# Patient Record
Sex: Male | Born: 1960 | Race: White | Hispanic: No | Marital: Single | Smoking: Former smoker
Health system: Southern US, Community
[De-identification: ages and names within clinical notes are randomized; demographics above are authoritative.]

## PROBLEM LIST (undated history)

## (undated) DIAGNOSIS — E785 Hyperlipidemia, unspecified: Secondary | ICD-10-CM

---

## 2004-03-30 ENCOUNTER — Ambulatory Visit (HOSPITAL_COMMUNITY): Admission: RE | Admit: 2004-03-30 | Discharge: 2004-03-30 | Payer: Self-pay | Admitting: Gastroenterology

## 2008-07-01 ENCOUNTER — Encounter: Admission: RE | Admit: 2008-07-01 | Discharge: 2008-07-01 | Payer: Self-pay | Admitting: Family Medicine

## 2009-03-21 ENCOUNTER — Emergency Department (HOSPITAL_COMMUNITY): Admission: EM | Admit: 2009-03-21 | Discharge: 2009-03-21 | Payer: Self-pay | Admitting: Emergency Medicine

## 2009-03-27 ENCOUNTER — Ambulatory Visit (HOSPITAL_COMMUNITY): Admission: RE | Admit: 2009-03-27 | Discharge: 2009-03-27 | Payer: Self-pay | Admitting: Urology

## 2010-08-23 ENCOUNTER — Ambulatory Visit (HOSPITAL_COMMUNITY)
Admission: RE | Admit: 2010-08-23 | Discharge: 2010-08-23 | Payer: Self-pay | Source: Home / Self Care | Attending: Gastroenterology | Admitting: Gastroenterology

## 2010-12-23 LAB — BASIC METABOLIC PANEL
CO2: 25 mEq/L (ref 19–32)
Calcium: 9.3 mg/dL (ref 8.4–10.5)
GFR calc non Af Amer: >60 mL/min (ref >60)
Glucose, Bld: 132 mg/dL — ABNORMAL HIGH (ref 70–99)
Potassium: 3.9 mEq/L (ref 3.5–5.1)

## 2010-12-23 LAB — CBC
HCT: 43.8 % (ref 39.0–52.0)
Hemoglobin: 14.7 g/dL (ref 13.0–17.0)
MCHC: 33.5 g/dL (ref 30.0–36.0)
Platelets: 256 10*3/uL (ref 150–400)
RDW: 12.8 % (ref 11.5–15.5)
WBC: 12.6 10*3/uL — ABNORMAL HIGH (ref 4.0–10.5)

## 2010-12-23 LAB — URINALYSIS, ROUTINE W REFLEX MICROSCOPIC
Nitrite: NEGATIVE
Protein, ur: 100 mg/dL — AB
Specific Gravity, Urine: 1.028 (ref 1.005–1.030)

## 2010-12-23 LAB — DIFFERENTIAL
Basophils Absolute: 0 10*3/uL (ref 0.0–0.1)
Monocytes Absolute: 0.9 10*3/uL (ref 0.1–1.0)
Monocytes Relative: 7 % (ref 3–12)
Neutro Abs: 10.3 10*3/uL — ABNORMAL HIGH (ref 1.7–7.7)

## 2010-12-23 LAB — URINE MICROSCOPIC-ADD ON

## 2011-05-10 IMAGING — CT CT PELVIS W/O CM
2 of 3 series · 17 of 36 positions shown, 19 images · non-contrast
Comparison: None

CT ABDOMEN

CLINICAL DATA: Right flank pain this morning with some dysuria

CT ABDOMEN AND PELVIS WITHOUT CONTRAST
TECHNIQUE: Multidetector CT imaging of the abdomen and pelvis was
performed following the standard protocol without intravenous
contrast.

[Series 4: lung windows · axial · 0.74mm/px · z∈[-224,-130]mm · 14 of 22 slices shown, 16 images]
[im 2/22  soft-tissue]
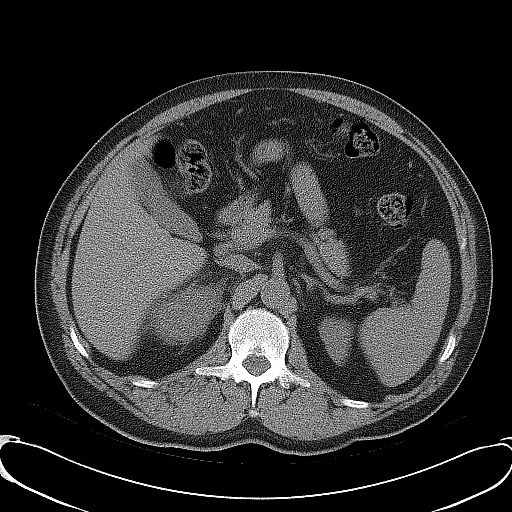
[im 2/22  bone]
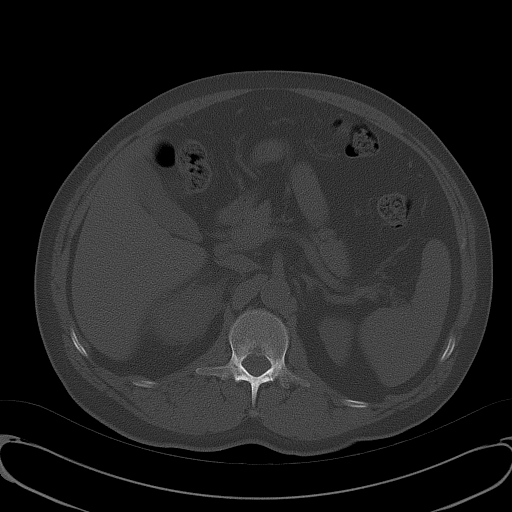
[im 4/22  soft-tissue]
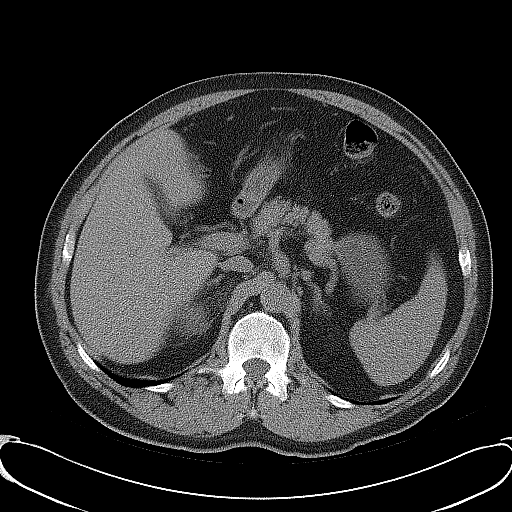
[im 5/22  soft-tissue]
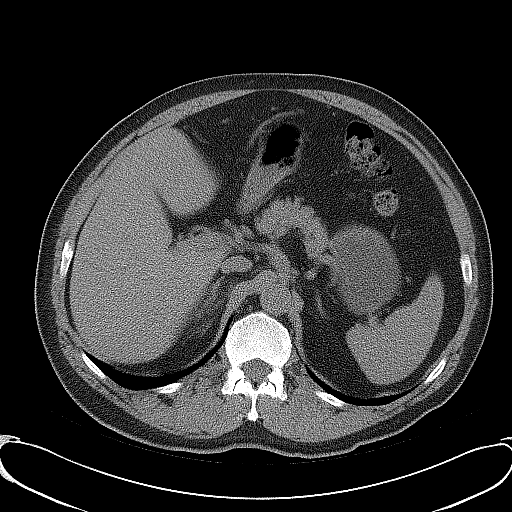
[im 7/22  soft-tissue]
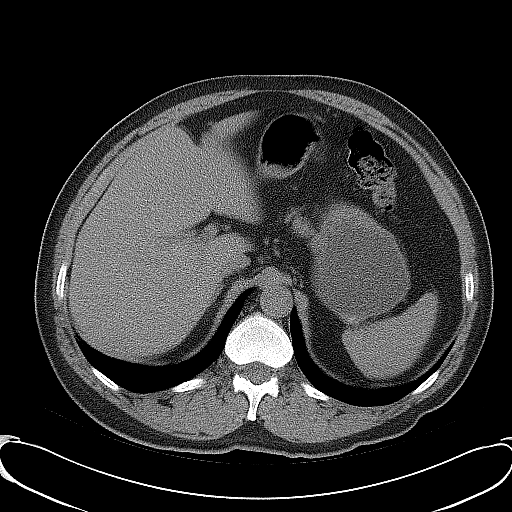
[im 8/22  soft-tissue]
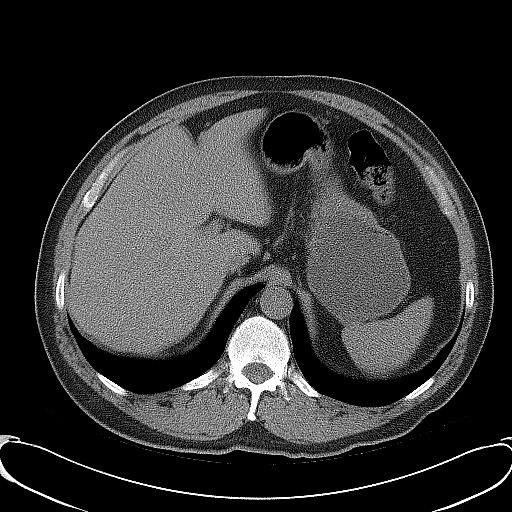
[im 9/22  soft-tissue]
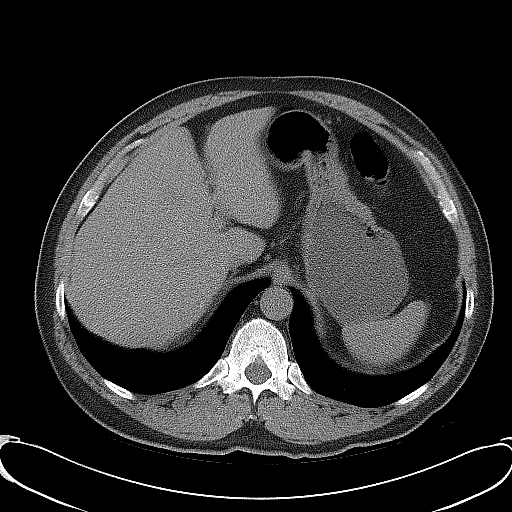
[im 11/22  soft-tissue]
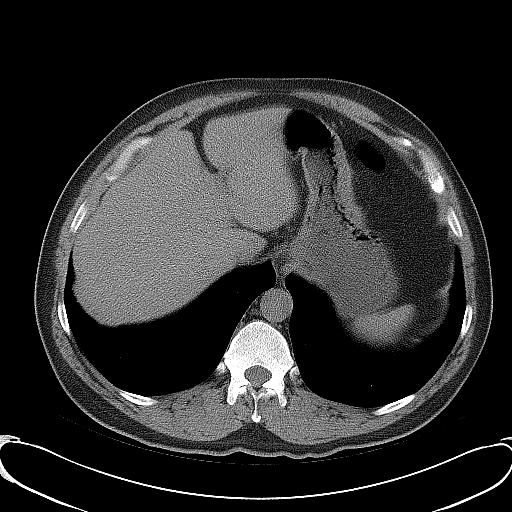
[im 12/22  soft-tissue]
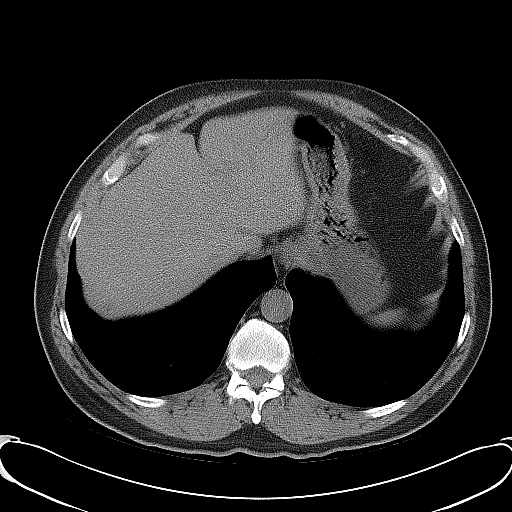
[im 14/22  soft-tissue]
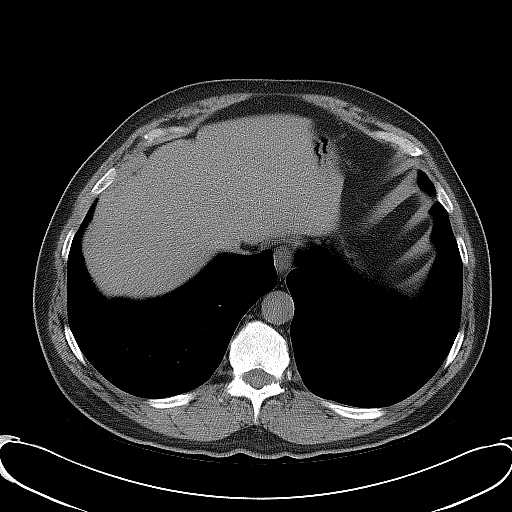
[im 14/22  bone]
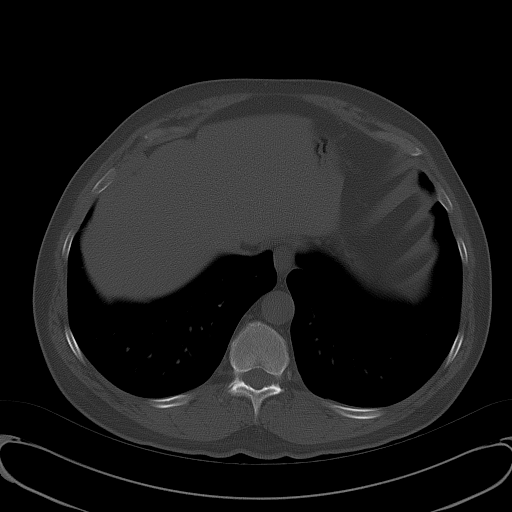
[im 15/22  soft-tissue]
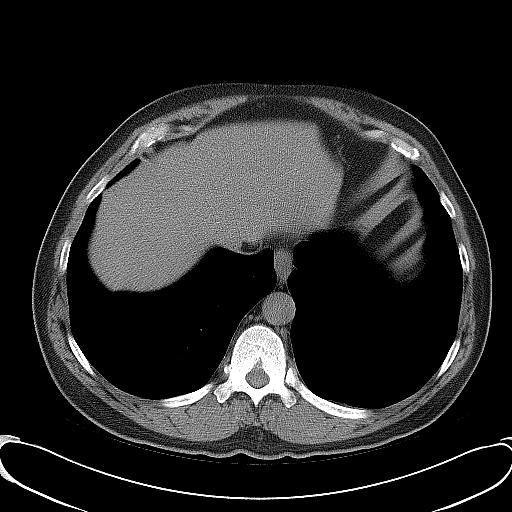
[im 17/22  soft-tissue]
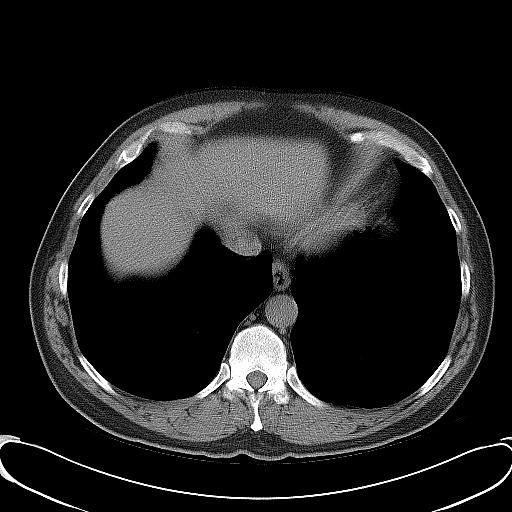
[im 18/22  soft-tissue]
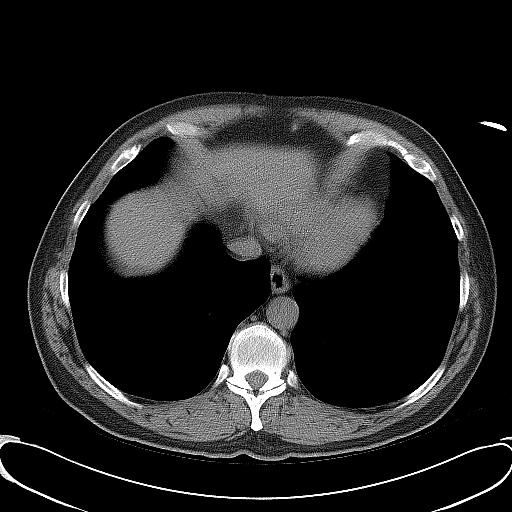
[im 19/22  soft-tissue]
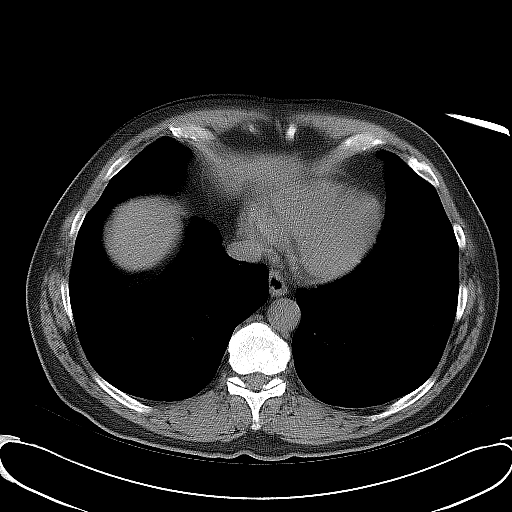
[im 21/22  soft-tissue]
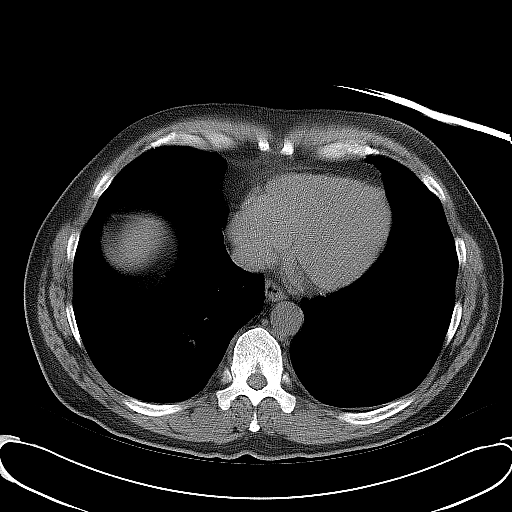

[Series 602: <mpr thick range> · coronal · 0.82mm/px · 3 of 88 slices shown]
[im 30/88  soft-tissue]
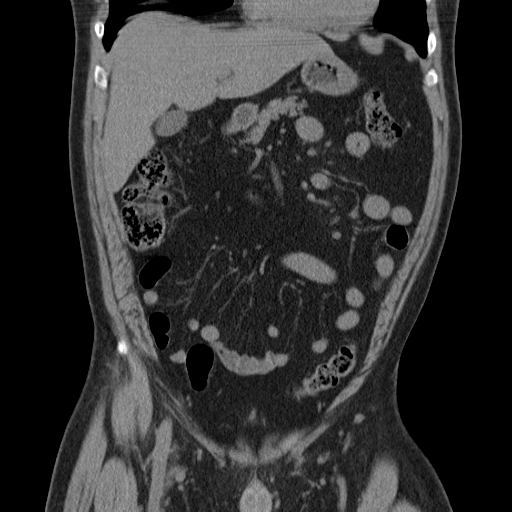
[im 39/88  soft-tissue]
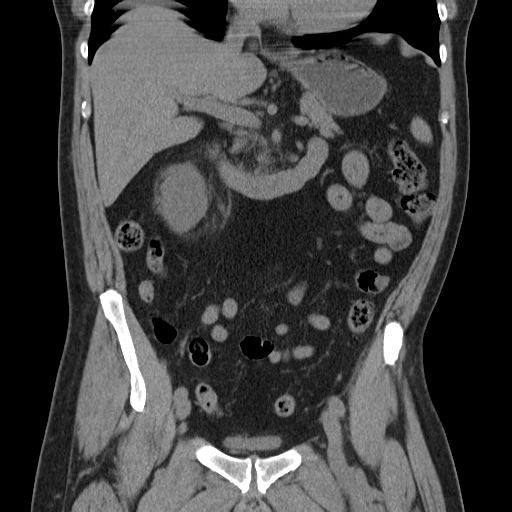
[im 49/88  soft-tissue]
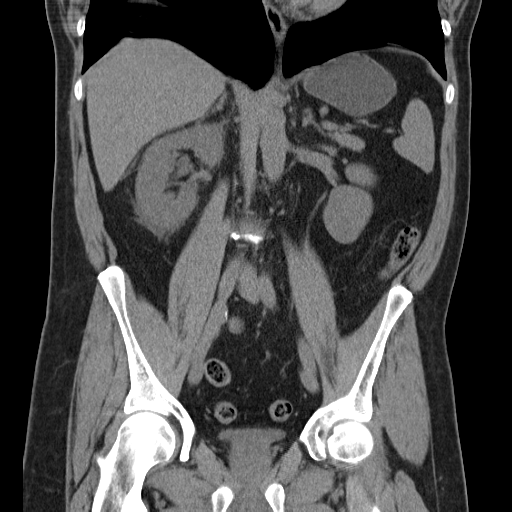

[17 of 36 positions shown; findings below may reference images not displayed]

FINDINGS: The lung bases are clear.  The liver appears normal in
the unenhanced state.  No calcified gallstones are seen.  The
pancreas is normal in size and the pancreatic duct is not dilated.
The adrenal glands and spleen appear normal.  The stomach is
grossly normal.  There is a moderate right hydronephrosis and
hydroureter present to a point of obstruction by a 6 x 7 mm mid
right ureteral calculus at the level of the right iliac crest.
Right perinephric and periureteric strandiness is noted.  No other
renal calculi are seen.  The abdominal aorta is normal in caliber.
IMPRESSION: 1.  Moderate right hydronephrosis caused by 6 x 7 mm mid right
ureteral calculus at the level of the right iliac crest.
 2.  No other renal calculi are noted.

CT PELVIS
FINDINGS: The appendix is well seen and appears normal, as is the
terminal ileum.  The distal ureters are normal in caliber and no
distal ureteral calculi are seen.  Urinary bladder is decompressed
cannot be evaluated.  The prostate is within normal limits in size
for age.  No bony abnormality is seen.
IMPRESSION: No distal ureteral calculi noted.  The terminal ileum and appendix
appear normal.

## 2017-09-03 ENCOUNTER — Other Ambulatory Visit: Payer: Self-pay | Admitting: Physician Assistant

## 2017-09-03 DIAGNOSIS — R1011 Right upper quadrant pain: Secondary | ICD-10-CM

## 2017-09-08 ENCOUNTER — Ambulatory Visit
Admission: RE | Admit: 2017-09-08 | Discharge: 2017-09-08 | Disposition: A | Discharge status: Home / Self Care | Payer: BLUE CROSS/BLUE SHIELD | Source: Ambulatory Visit | Attending: Physician Assistant | Admitting: Physician Assistant

## 2017-09-08 DIAGNOSIS — R1011 Right upper quadrant pain: Secondary | ICD-10-CM

## 2018-06-18 ENCOUNTER — Encounter (HOSPITAL_COMMUNITY): Payer: Self-pay | Admitting: Emergency Medicine

## 2018-06-18 ENCOUNTER — Emergency Department (HOSPITAL_COMMUNITY): Payer: BLUE CROSS/BLUE SHIELD

## 2018-06-18 ENCOUNTER — Emergency Department (HOSPITAL_COMMUNITY)
Admission: EM | Admit: 2018-06-18 | Discharge: 2018-06-18 | Disposition: A | Discharge status: Home / Self Care | Payer: BLUE CROSS/BLUE SHIELD | Attending: Emergency Medicine | Admitting: Emergency Medicine

## 2018-06-18 DIAGNOSIS — R072 Precordial pain: Secondary | ICD-10-CM | POA: Diagnosis not present

## 2018-06-18 DIAGNOSIS — Z79899 Other long term (current) drug therapy: Secondary | ICD-10-CM | POA: Diagnosis not present

## 2018-06-18 DIAGNOSIS — R079 Chest pain, unspecified: Secondary | ICD-10-CM | POA: Diagnosis present

## 2018-06-18 DIAGNOSIS — Z7982 Long term (current) use of aspirin: Secondary | ICD-10-CM | POA: Insufficient documentation

## 2018-06-18 DIAGNOSIS — Z87891 Personal history of nicotine dependence: Secondary | ICD-10-CM | POA: Insufficient documentation

## 2018-06-18 HISTORY — DX: Hyperlipidemia, unspecified: E78.5

## 2018-06-18 LAB — BASIC METABOLIC PANEL
ANION GAP: 11 (ref 5–15)
BUN: 7 mg/dL (ref 6–20)
CALCIUM: 9.2 mg/dL (ref 8.9–10.3)
CO2: 23 mmol/L (ref 22–32)
Chloride: 106 mmol/L (ref 98–111)
Creatinine, Ser: 0.64 mg/dL (ref 0.61–1.24)
GFR calc Af Amer: >60 mL/min (ref >60)
GLUCOSE: 111 mg/dL — AB (ref 70–99)
POTASSIUM: 3.4 mmol/L — AB (ref 3.5–5.1)
Sodium: 140 mmol/L (ref 135–145)

## 2018-06-18 LAB — I-STAT TROPONIN, ED
Troponin i, poc: 0 ng/mL (ref 0.00–0.08)
Troponin i, poc: 0 ng/mL (ref 0.00–0.08)

## 2018-06-18 LAB — CBC
HEMATOCRIT: 45.9 % (ref 39.0–52.0)
Hemoglobin: 14.9 g/dL (ref 13.0–17.0)
MCH: 31.2 pg (ref 26.0–34.0)
MCHC: 32.5 g/dL (ref 30.0–36.0)
MCV: 96.2 fL (ref 78.0–100.0)
Platelets: 213 10*3/uL (ref 150–400)
RBC: 4.77 MIL/uL (ref 4.22–5.81)
RDW: 12.1 % (ref 11.5–15.5)
WBC: 5.5 10*3/uL (ref 4.0–10.5)

## 2018-06-18 MED ORDER — ALUM & MAG HYDROXIDE-SIMETH 200-200-20 MG/5ML PO SUSP
30.0000 mL | Freq: Once | ORAL | Status: AC
  Administered 2018-06-18: 30 mL via ORAL
  Filled 2018-06-18: qty 30

## 2018-06-18 NOTE — ED Notes (Signed)
Pt stated that though his chest pain has diminished, he is still experiencing shaking in his right hand. Pt states that "it feels like I'm not getting any blood flow to that hand, like it's fallen asleep or something".

## 2018-06-18 NOTE — ED Notes (Signed)
Patient verbalizes understanding of discharge instructions. Opportunity for questioning and answers were provided. Ambulatory at discharge in NAD.  
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
## 2018-06-18 NOTE — ED Notes (Signed)
Per MD, Pt ok to have water. Given ~6oz ice water.

## 2018-06-18 NOTE — ED Provider Notes (Signed)
[facility] EMERGENCY DEPARTMENT Provider Note   CSN: [id number] Arrival date & time: 06/18/18  1019     History   Chief Complaint Chief Complaint  Patient presents with  . Chest Pain    HPI Rickey Kent is a 57 y.o. male.  HPI Patient is a 57 year old male with a history of hyperlipidemia and a family history of cardiac disease who presents to the emergency department today with a feeling of chest pressure in his left anterior chest without radiation.  He denies shortness of breath.  Denies nausea vomiting.  He has a history of hyperlipidemia.  No prior history of coronary artery disease.  No history of DVT or pulmonary embolism.  Denies weakness of his arms or legs.  No back pain.  No neck or jaw pain.  He states he overall feels "shaky".   Past Medical History:  Diagnosis Date  . Hyperlipidemia     There are no active problems to display for this patient.   History reviewed. No pertinent surgical history.      Home Medications    Prior to Admission medications   Medication Sig Start Date End Date Taking? Authorizing Provider  aspirin 325 MG tablet Take 325 mg by mouth daily.   Yes [provider]  atorvastatin (LIPITOR) 20 MG tablet Take 20 mg by mouth at bedtime. 10/10/15  Yes [provider]    Family History No family history on file.  Social History Social History   Tobacco Use  . Smoking status: Former Smoker    Last attempt to quit: 1990    Years since quitting: 29.7  . Smokeless tobacco: Never Used  Substance Use Topics  . Alcohol use: Yes  . Drug use: Never     Allergies   Patient has no known allergies.   Review of Systems Review of Systems  All other systems reviewed and are negative.    Physical Exam Updated Vital Signs BP (!) 154/95   Pulse 69   Temp 98.3 F (36.8 C) (Oral)   Resp 19   Ht 5' 11.5" (1.816 m)   Wt 74.8 kg   SpO2 97%   BMI 22.69 kg/m   Physical Exam  Constitutional: He  is oriented to person, place, and time. He appears well-developed and well-nourished.  HENT:  Head: Normocephalic and atraumatic.  Eyes: EOM are normal.  Neck: Normal range of motion.  Cardiovascular: Normal rate, regular rhythm, normal heart sounds and intact distal pulses.  Pulmonary/Chest: Effort normal and breath sounds normal. No respiratory distress.  Abdominal: Soft. He exhibits no distension. There is no tenderness.  Musculoskeletal: Normal range of motion.  5 out of 5 strength in bilateral upper and lower extremity major muscle groups.  Normal grip strength bilaterally.  Normal equal radial pulses bilaterally.  Neurological: He is alert and oriented to person, place, and time.  Skin: Skin is warm and dry.  Psychiatric: He has a normal mood and affect. Judgment normal.  Nursing note and vitals reviewed.    ED Treatments / Results  Labs (all labs ordered are listed, but only abnormal results are displayed) Labs Reviewed  BASIC METABOLIC PANEL - Abnormal; Notable for the following components:      Result Value   Potassium 3.4 (*)    Glucose, Bld 111 (*)    All other components within normal limits  CBC  I-STAT TROPONIN, ED  I-STAT TROPONIN, ED    EKG EKG Interpretation  Date/Time:  Thursday June 18 2018 10:28:51 EDT Ventricular Rate:  66 PR Interval:  144 QRS Duration: 84 QT Interval:  428 QTC Calculation: 448 R Axis:   75 Text Interpretation:  Normal sinus rhythm Normal ECG No old tracing to compare Confirmed by Azalia Bilis ([id number]) on 06/18/2018 12:16:00 PM   Radiology Dg Chest 2 View  Result Date: 06/18/2018 CLINICAL DATA:  Sudden onset of chest heaviness today also tremors. Hypertensive in the emergency department. EXAM: CHEST - 2 VIEW COMPARISON:  None. FINDINGS: The lungs are hyperinflated with hemidiaphragm flattening. There is no focal infiltrate. The heart and pulmonary vascularity are normal. The mediastinum is normal in width. There is calcification  in the wall of the aortic arch. The trachea is midline. The bony thorax exhibits no acute abnormality. IMPRESSION: COPD.  No pneumonia, CHF, nor other acute cardiopulmonary disease. Thoracic aortic atherosclerosis. Electronically Signed   By: David  [place] M.D.   On: 06/18/2018 11:26    Procedures Procedures (including critical care time)  Medications Ordered in ED Medications  alum & mag hydroxide-simeth (MAALOX/MYLANTA) 200-200-20 MG/5ML suspension 30 mL (30 mLs Oral Given 06/18/18 1236)     Initial Impression / Assessment and Plan / ED Course  I have reviewed the triage vital signs and the nursing notes.  Pertinent labs & imaging results that were available during my care of the patient were reviewed by me and considered in my medical decision making (see chart for details).     Nonspecific chest pain.  Feels better after belching.  Feels better after Maalox here in the emergency department.  No longer with chest discomfort.  EKG without acute ischemic changes.  Troponin negative x2.  Doubt PE.  Doubt dissection.  No abdominal discomfort.  Patient family encouraged to return in the emergency department for new or worsening symptoms.  Close PCP follow-up.  BP to be rechecked by his primary care provider  Final Clinical Impressions(s) / ED Diagnoses   Final diagnoses:  Precordial chest pain    ED Discharge Orders    None       Azalia Bilis, MD 06/18/18 1458

## 2018-08-26 ENCOUNTER — Emergency Department (HOSPITAL_COMMUNITY): Payer: BLUE CROSS/BLUE SHIELD

## 2018-08-26 ENCOUNTER — Observation Stay (HOSPITAL_COMMUNITY)
Admission: EM | Admit: 2018-08-26 | Discharge: 2018-08-26 | Discharge status: Against Medical Advice | Payer: BLUE CROSS/BLUE SHIELD | Attending: Surgery | Admitting: Surgery

## 2018-08-26 ENCOUNTER — Encounter (HOSPITAL_COMMUNITY): Payer: Self-pay | Admitting: Emergency Medicine

## 2018-08-26 DIAGNOSIS — N400 Enlarged prostate without lower urinary tract symptoms: Secondary | ICD-10-CM | POA: Diagnosis not present

## 2018-08-26 DIAGNOSIS — S27331A Laceration of lung, unilateral, initial encounter: Secondary | ICD-10-CM | POA: Insufficient documentation

## 2018-08-26 DIAGNOSIS — S2242XA Multiple fractures of ribs, left side, initial encounter for closed fracture: Principal | ICD-10-CM | POA: Insufficient documentation

## 2018-08-26 DIAGNOSIS — J9811 Atelectasis: Secondary | ICD-10-CM | POA: Diagnosis not present

## 2018-08-26 DIAGNOSIS — Z87891 Personal history of nicotine dependence: Secondary | ICD-10-CM | POA: Insufficient documentation

## 2018-08-26 DIAGNOSIS — Y92410 Unspecified street and highway as the place of occurrence of the external cause: Secondary | ICD-10-CM | POA: Insufficient documentation

## 2018-08-26 DIAGNOSIS — Y908 Blood alcohol level of 240 mg/100 ml or more: Secondary | ICD-10-CM | POA: Insufficient documentation

## 2018-08-26 DIAGNOSIS — J90 Pleural effusion, not elsewhere classified: Secondary | ICD-10-CM | POA: Insufficient documentation

## 2018-08-26 DIAGNOSIS — E785 Hyperlipidemia, unspecified: Secondary | ICD-10-CM | POA: Diagnosis not present

## 2018-08-26 DIAGNOSIS — J939 Pneumothorax, unspecified: Secondary | ICD-10-CM

## 2018-08-26 DIAGNOSIS — I700 Atherosclerosis of aorta: Secondary | ICD-10-CM | POA: Diagnosis not present

## 2018-08-26 DIAGNOSIS — S270XXA Traumatic pneumothorax, initial encounter: Secondary | ICD-10-CM | POA: Diagnosis not present

## 2018-08-26 DIAGNOSIS — Z79899 Other long term (current) drug therapy: Secondary | ICD-10-CM | POA: Diagnosis not present

## 2018-08-26 DIAGNOSIS — F1010 Alcohol abuse, uncomplicated: Secondary | ICD-10-CM | POA: Diagnosis not present

## 2018-08-26 DIAGNOSIS — S27339A Laceration of lung, unspecified, initial encounter: Secondary | ICD-10-CM

## 2018-08-26 DIAGNOSIS — S2249XA Multiple fractures of ribs, unspecified side, initial encounter for closed fracture: Secondary | ICD-10-CM | POA: Diagnosis present

## 2018-08-26 DIAGNOSIS — S2239XA Fracture of one rib, unspecified side, initial encounter for closed fracture: Secondary | ICD-10-CM

## 2018-08-26 DIAGNOSIS — F10929 Alcohol use, unspecified with intoxication, unspecified: Secondary | ICD-10-CM

## 2018-08-26 LAB — CBC WITH DIFFERENTIAL/PLATELET
Abs Immature Granulocytes: 0.03 10*3/uL (ref 0.00–0.07)
BASOS PCT: 1 %
Basophils Absolute: 0 10*3/uL (ref 0.0–0.1)
Eosinophils Absolute: 0.1 10*3/uL (ref 0.0–0.5)
Eosinophils Relative: 2 %
HCT: 43.3 % (ref 39.0–52.0)
Hemoglobin: 13.6 g/dL (ref 13.0–17.0)
Immature Granulocytes: 1 %
Lymphocytes Relative: 44 %
Lymphs Abs: 2.4 10*3/uL (ref 0.7–4.0)
MCH: 30.4 pg (ref 26.0–34.0)
MCHC: 31.4 g/dL (ref 30.0–36.0)
MCV: 96.7 fL (ref 80.0–100.0)
Monocytes Absolute: 0.3 10*3/uL (ref 0.1–1.0)
Monocytes Relative: 5 %
Neutro Abs: 2.6 10*3/uL (ref 1.7–7.7)
Neutrophils Relative %: 47 %
PLATELETS: 199 10*3/uL (ref 150–400)
RBC: 4.48 MIL/uL (ref 4.22–5.81)
RDW: 12.2 % (ref 11.5–15.5)
WBC: 5.4 10*3/uL (ref 4.0–10.5)
nRBC: 0 % (ref 0.0–0.2)

## 2018-08-26 LAB — COMPREHENSIVE METABOLIC PANEL
ALT: 66 U/L — ABNORMAL HIGH (ref 0–44)
AST: 80 U/L — ABNORMAL HIGH (ref 15–41)
Albumin: 4 g/dL (ref 3.5–5.0)
Alkaline Phosphatase: 52 U/L (ref 38–126)
Anion gap: 11 (ref 5–15)
BILIRUBIN TOTAL: 1.1 mg/dL (ref 0.3–1.2)
BUN: 7 mg/dL (ref 6–20)
CO2: 28 mmol/L (ref 22–32)
Calcium: 8.4 mg/dL — ABNORMAL LOW (ref 8.9–10.3)
Chloride: 104 mmol/L (ref 98–111)
Creatinine, Ser: 0.72 mg/dL (ref 0.61–1.24)
GFR calc Af Amer: >60 mL/min (ref >60)
Glucose, Bld: 108 mg/dL — ABNORMAL HIGH (ref 70–99)
Potassium: 3.9 mmol/L (ref 3.5–5.1)
Sodium: 143 mmol/L (ref 135–145)
Total Protein: 7.2 g/dL (ref 6.5–8.1)

## 2018-08-26 LAB — I-STAT CHEM 8, ED
BUN: 7 mg/dL (ref 6–20)
Calcium, Ion: 1.05 mmol/L — ABNORMAL LOW (ref 1.15–1.40)
Chloride: 104 mmol/L (ref 98–111)
Creatinine, Ser: 1.3 mg/dL — ABNORMAL HIGH (ref 0.61–1.24)
Glucose, Bld: 105 mg/dL — ABNORMAL HIGH (ref 70–99)
HCT: 43 % (ref 39.0–52.0)
Hemoglobin: 14.6 g/dL (ref 13.0–17.0)
Potassium: 3.9 mmol/L (ref 3.5–5.1)
Sodium: 143 mmol/L (ref 135–145)
TCO2: 32 mmol/L (ref 22–32)

## 2018-08-26 LAB — RAPID URINE DRUG SCREEN, HOSP PERFORMED
Amphetamines: NOT DETECTED
Barbiturates: NOT DETECTED
Benzodiazepines: NOT DETECTED
Cocaine: NOT DETECTED
Opiates: NOT DETECTED
Tetrahydrocannabinol: NOT DETECTED

## 2018-08-26 LAB — ETHANOL: ALCOHOL ETHYL (B): 354 mg/dL — AB (ref <10)

## 2018-08-26 MED ORDER — LORAZEPAM 2 MG/ML IJ SOLN: 1.0000 mg | INTRAMUSCULAR | Status: DC | PRN

## 2018-08-26 MED ORDER — ADULT MULTIVITAMIN W/MINERALS CH: 1.0000 | ORAL_TABLET | Freq: Every day | ORAL | Status: DC

## 2018-08-26 MED ORDER — IOHEXOL 300 MG/ML  SOLN
100.0000 mL | Freq: Once | INTRAMUSCULAR | Status: AC | PRN
  Administered 2018-08-26: 100 mL via INTRAVENOUS

## 2018-08-26 MED ORDER — HYDROMORPHONE HCL 1 MG/ML IJ SOLN: 1.0000 mg | INTRAMUSCULAR | Status: DC | PRN

## 2018-08-26 MED ORDER — VITAMIN B-1 100 MG PO TABS: 100.0000 mg | ORAL_TABLET | Freq: Every day | ORAL | Status: DC

## 2018-08-26 MED ORDER — METOPROLOL TARTRATE 5 MG/5ML IV SOLN: 5.0000 mg | Freq: Four times a day (QID) | INTRAVENOUS | Status: DC | PRN

## 2018-08-26 MED ORDER — ONDANSETRON 4 MG PO TBDP: 4.0000 mg | ORAL_TABLET | Freq: Four times a day (QID) | ORAL | Status: DC | PRN

## 2018-08-26 MED ORDER — PANTOPRAZOLE SODIUM 40 MG PO TBEC: 40.0000 mg | DELAYED_RELEASE_TABLET | Freq: Every day | ORAL | Status: DC

## 2018-08-26 MED ORDER — ONDANSETRON HCL 4 MG/2ML IJ SOLN: 4.0000 mg | Freq: Four times a day (QID) | INTRAMUSCULAR | Status: DC | PRN

## 2018-08-26 MED ORDER — ENOXAPARIN SODIUM 40 MG/0.4ML ~~LOC~~ SOLN: 40.0000 mg | SUBCUTANEOUS | Status: DC

## 2018-08-26 MED ORDER — PANTOPRAZOLE SODIUM 40 MG IV SOLR: 40.0000 mg | Freq: Every day | INTRAVENOUS | Status: DC

## 2018-08-26 MED ORDER — FOLIC ACID 1 MG PO TABS: 1.0000 mg | ORAL_TABLET | Freq: Every day | ORAL | Status: DC

## 2018-08-26 MED ORDER — ACETAMINOPHEN 325 MG PO TABS: 650.0000 mg | ORAL_TABLET | Freq: Three times a day (TID) | ORAL | Status: DC

## 2018-08-26 MED ORDER — KCL IN DEXTROSE-NACL 20-5-0.45 MEQ/L-%-% IV SOLN
INTRAVENOUS | Status: DC
  Filled 2018-08-26: qty 1000

## 2018-08-26 MED ORDER — OXYCODONE HCL 5 MG PO TABS: 5.0000 mg | ORAL_TABLET | ORAL | Status: DC | PRN

## 2018-08-26 NOTE — ED Notes (Signed)
Pt keeps pulling off leads and b/p cuff and up walking around
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
## 2018-08-26 NOTE — ED Notes (Signed)
Pt removed himself from C collar and took off all cardiac leads.
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
## 2018-08-26 NOTE — ED Notes (Signed)
Pt resting in bed at this time. 

## 2018-08-26 NOTE — ED Notes (Signed)
Pt ambulated to RR with stand by assistance by [place] EMT
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
## 2018-08-26 NOTE — ED Notes (Signed)
Pt transported to CT ?

## 2018-08-26 NOTE — ED Notes (Signed)
Pt signed out AMA , pt given IS and told hid fiance reasons to bring pt back to ED , pt and family verbalized understanding

## 2018-08-27 ENCOUNTER — Encounter (HOSPITAL_COMMUNITY): Payer: Self-pay | Admitting: Emergency Medicine

## 2018-10-13 IMAGING — US US ABDOMEN COMPLETE
1 series · 14 of 25 positions shown · non-contrast
Comparison: CT 03/21/2009

CLINICAL DATA: Right upper quadrant abdominal pain over the last 2
years.

EXAM:
ABDOMEN ULTRASOUND COMPLETE

[Series 1: us abdomen complete · 0.19mm/px · 14 of 80 slices shown]
[im 1/80]
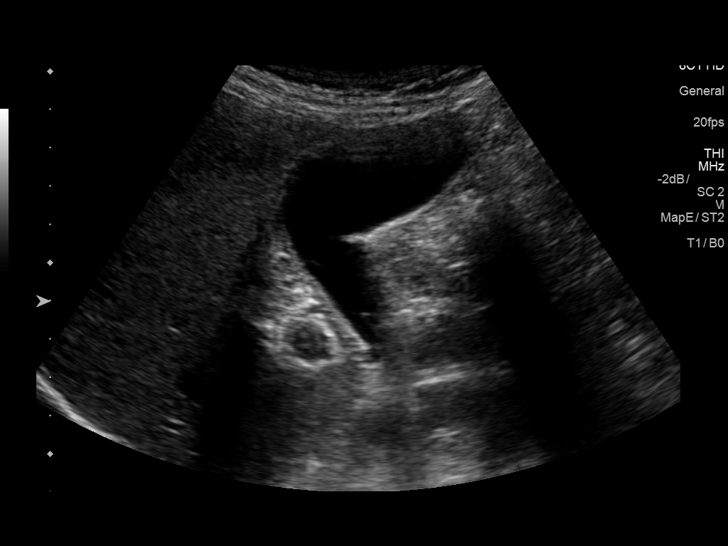
[im 7/80]
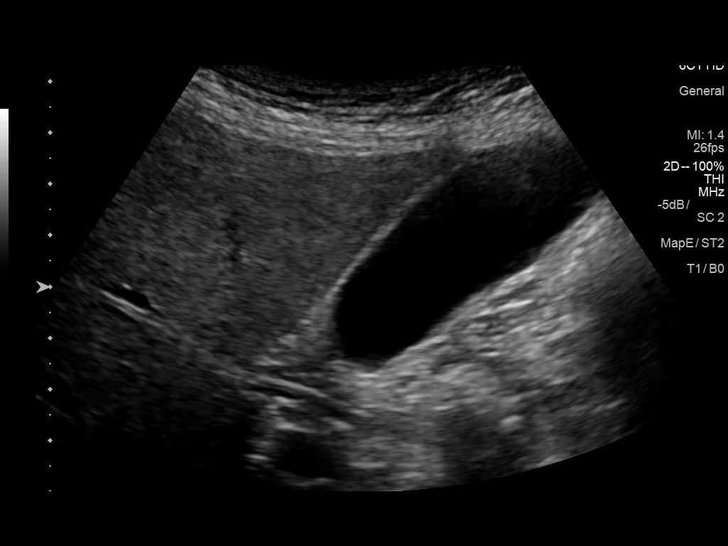
[im 14/80]
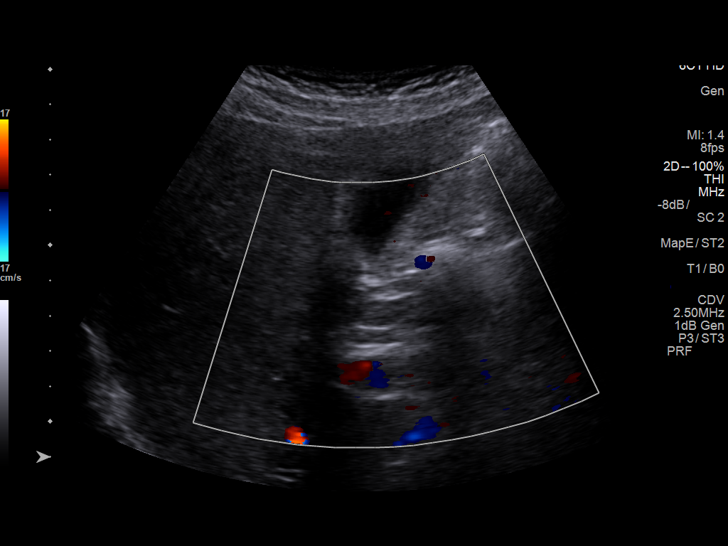
[im 20/80]
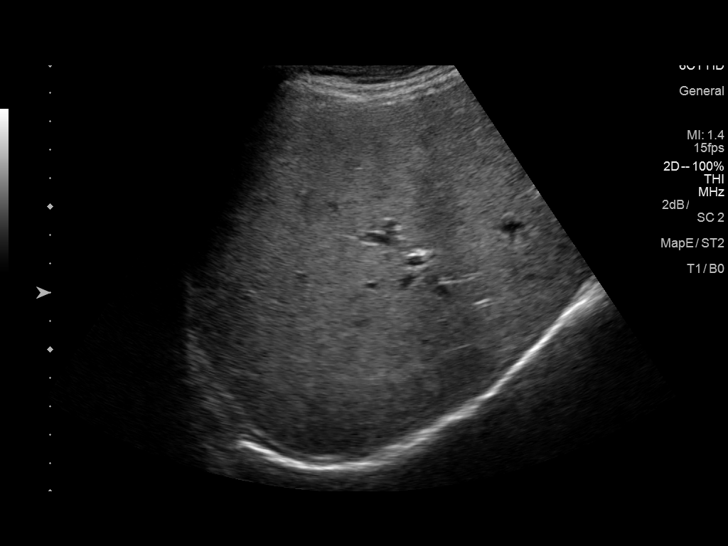
[im 27/80]
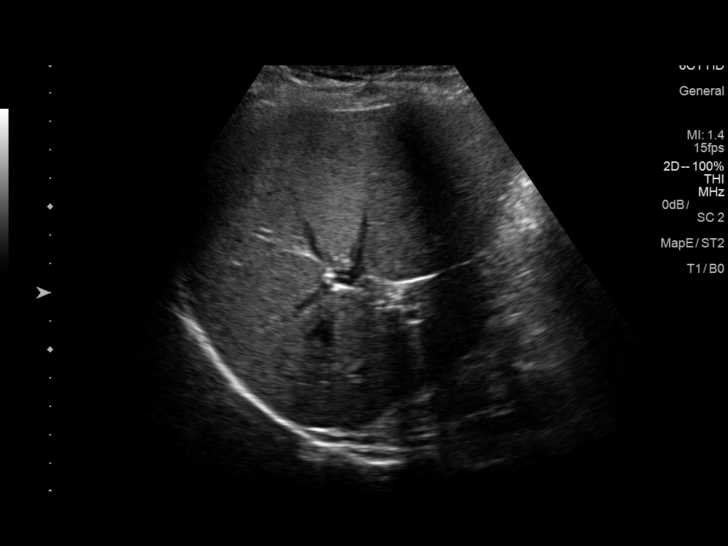
[im 30/80]
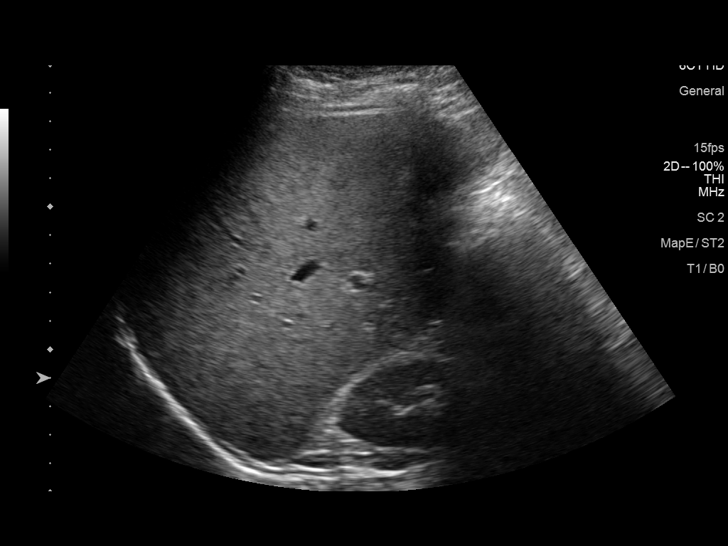
[im 37/80]
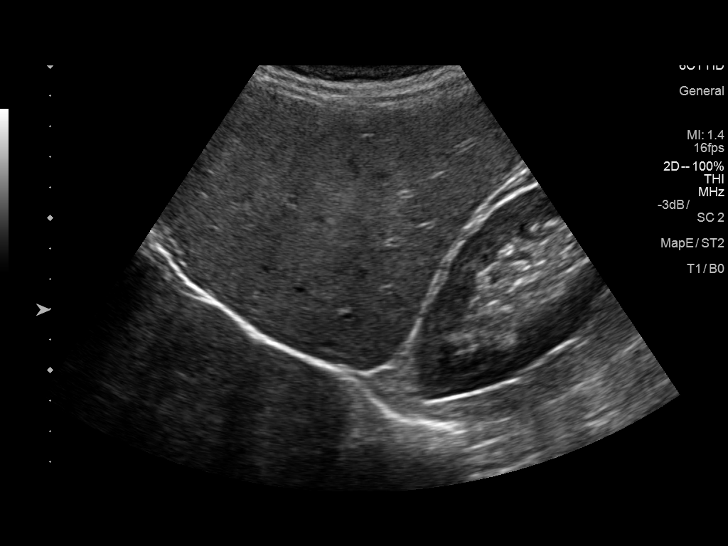
[im 43/80]
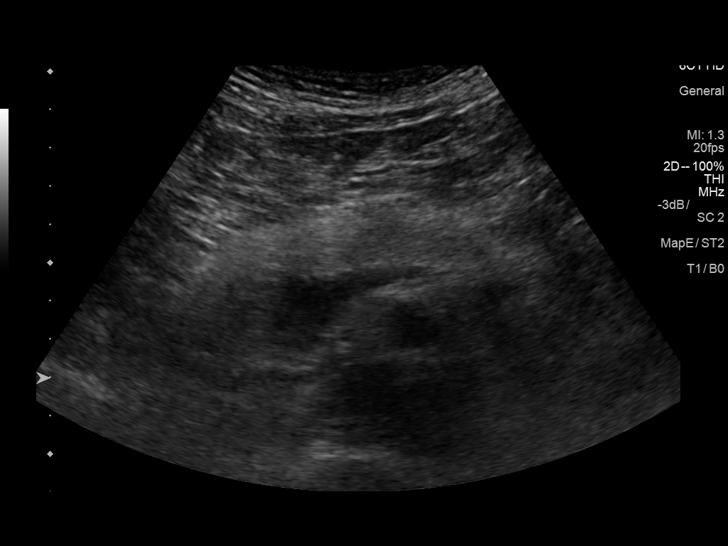
[im 50/80]
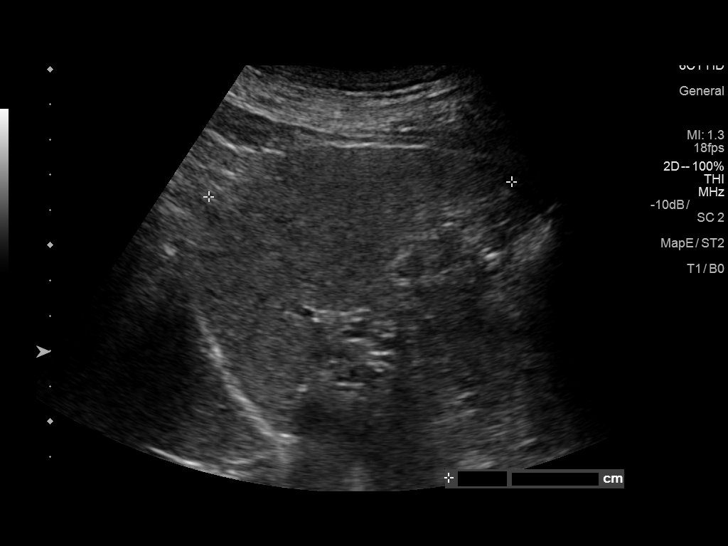
[im 53/80]
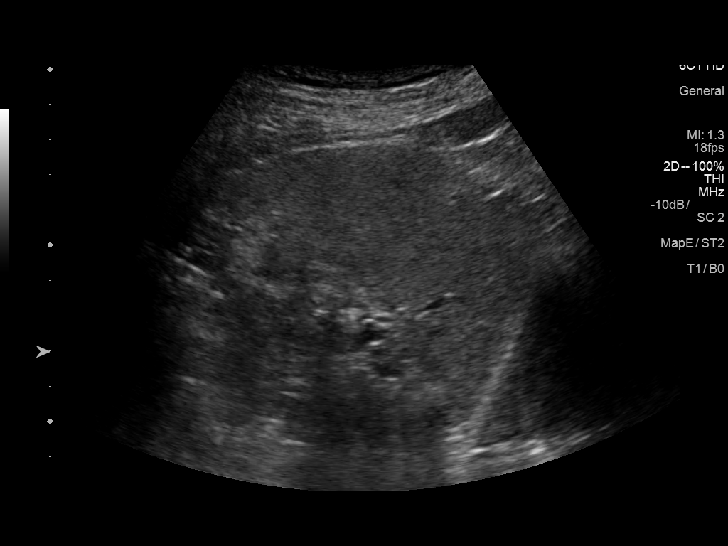
[im 60/80]
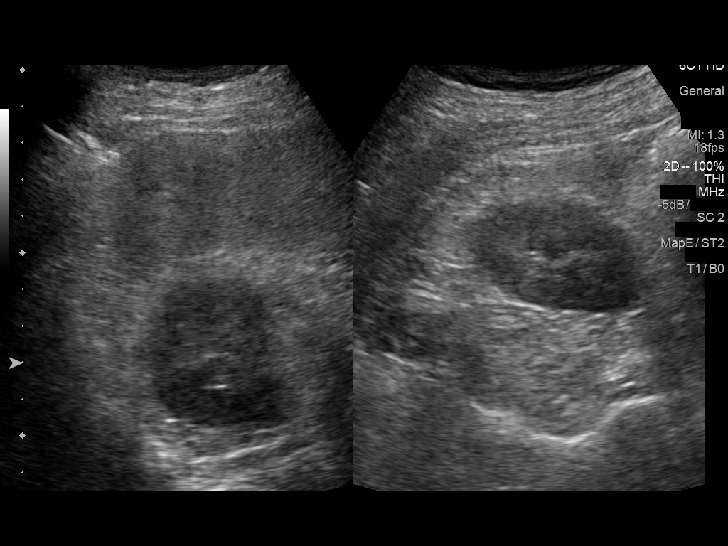
[im 66/80]
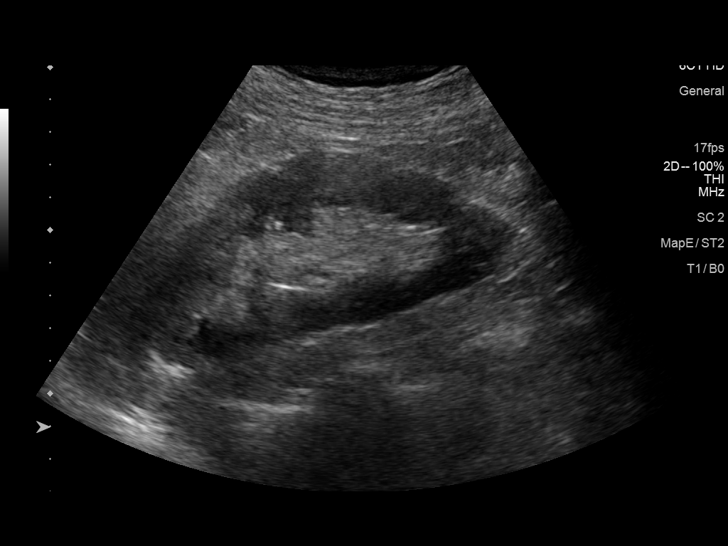
[im 73/80]
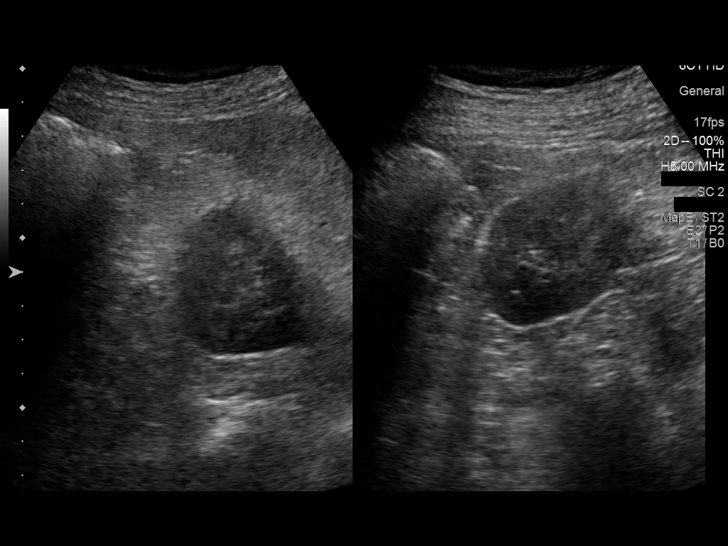
[im 80/80]
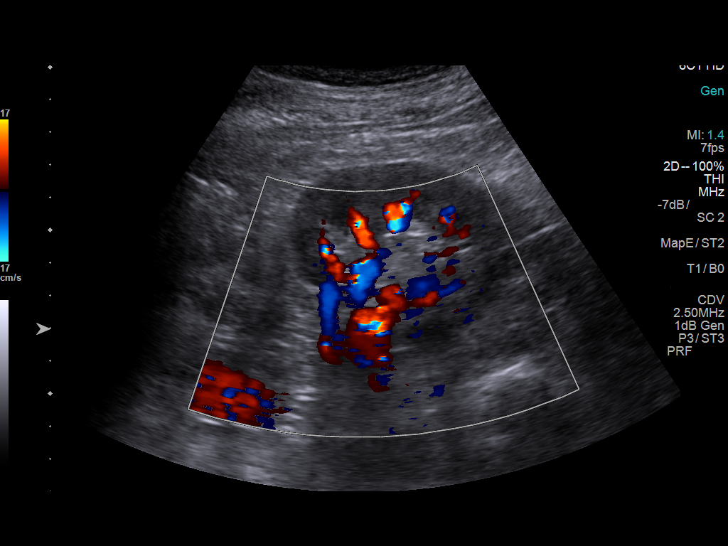

[14 of 25 positions shown; findings below may reference images not displayed]

FINDINGS: Gallbladder: No gallstones or wall thickening visualized. No
sonographic Murphy sign noted by sonographer.

Common bile duct: Diameter: 3 mm common normal

Liver: No focal lesion identified. Within normal limits in
parenchymal echogenicity. Portal vein is patent on color Doppler
imaging with normal direction of blood flow towards the liver.

IVC: No abnormality visualized.

Pancreas: Visualized portion unremarkable.

Spleen: Size and appearance within normal limits.

Right Kidney: Length: 10.8 cm. Echogenicity within normal limits. No
mass or hydronephrosis visualized.

Left Kidney: Length: 11.8 cm. Echogenicity within normal limits. No
mass or hydronephrosis visualized.

Abdominal aorta: No aneurysm visualized.

Other findings: No ascites
IMPRESSION: Normal abdominal ultrasound. No abnormality seen to explain right
upper quadrant pain.

## 2020-08-06 IMAGING — DX DG CHEST 2V
2 series · 2 of 2 positions shown · non-contrast
Comparison: None.

CLINICAL DATA: Sudden onset of chest heaviness today also tremors.
Hypertensive in the emergency department.

EXAM:
CHEST - 2 VIEW

[chest pa]
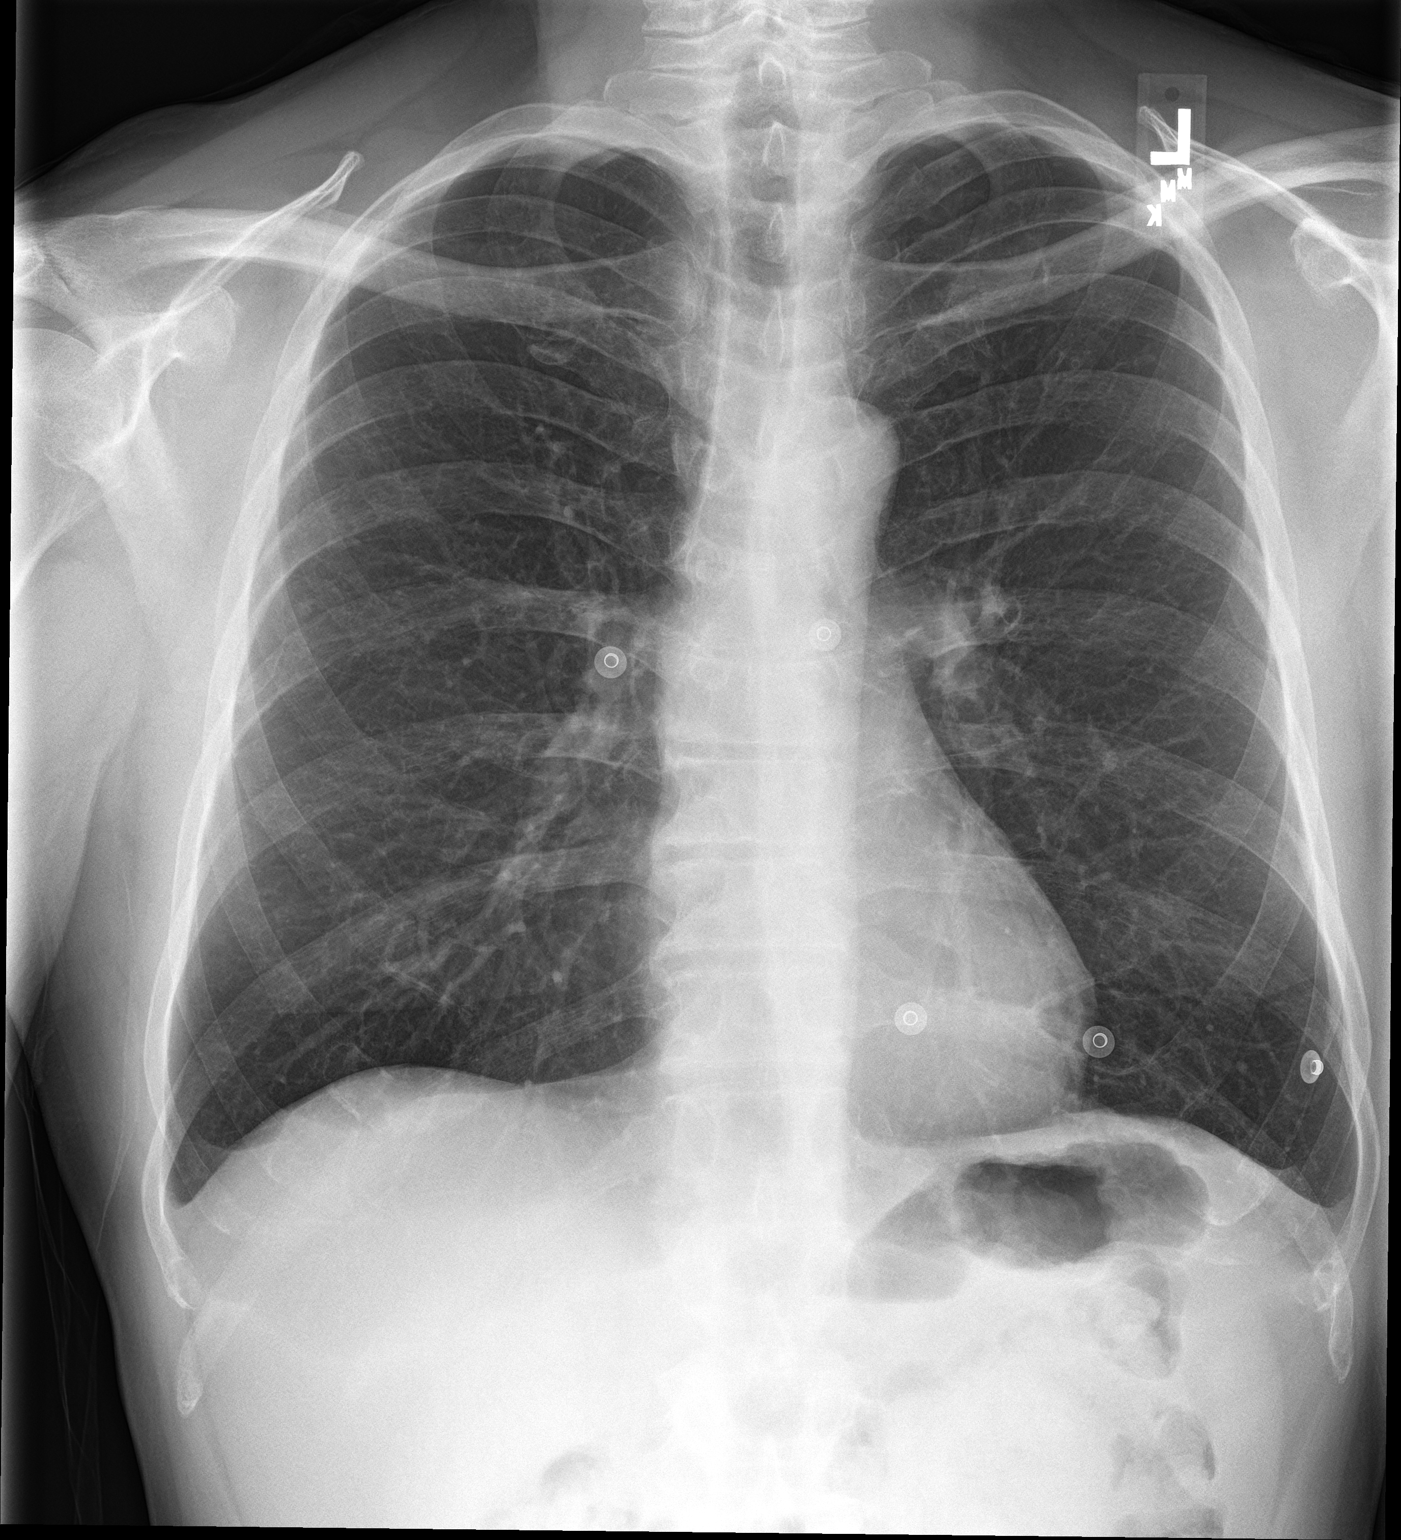

[chest lat]
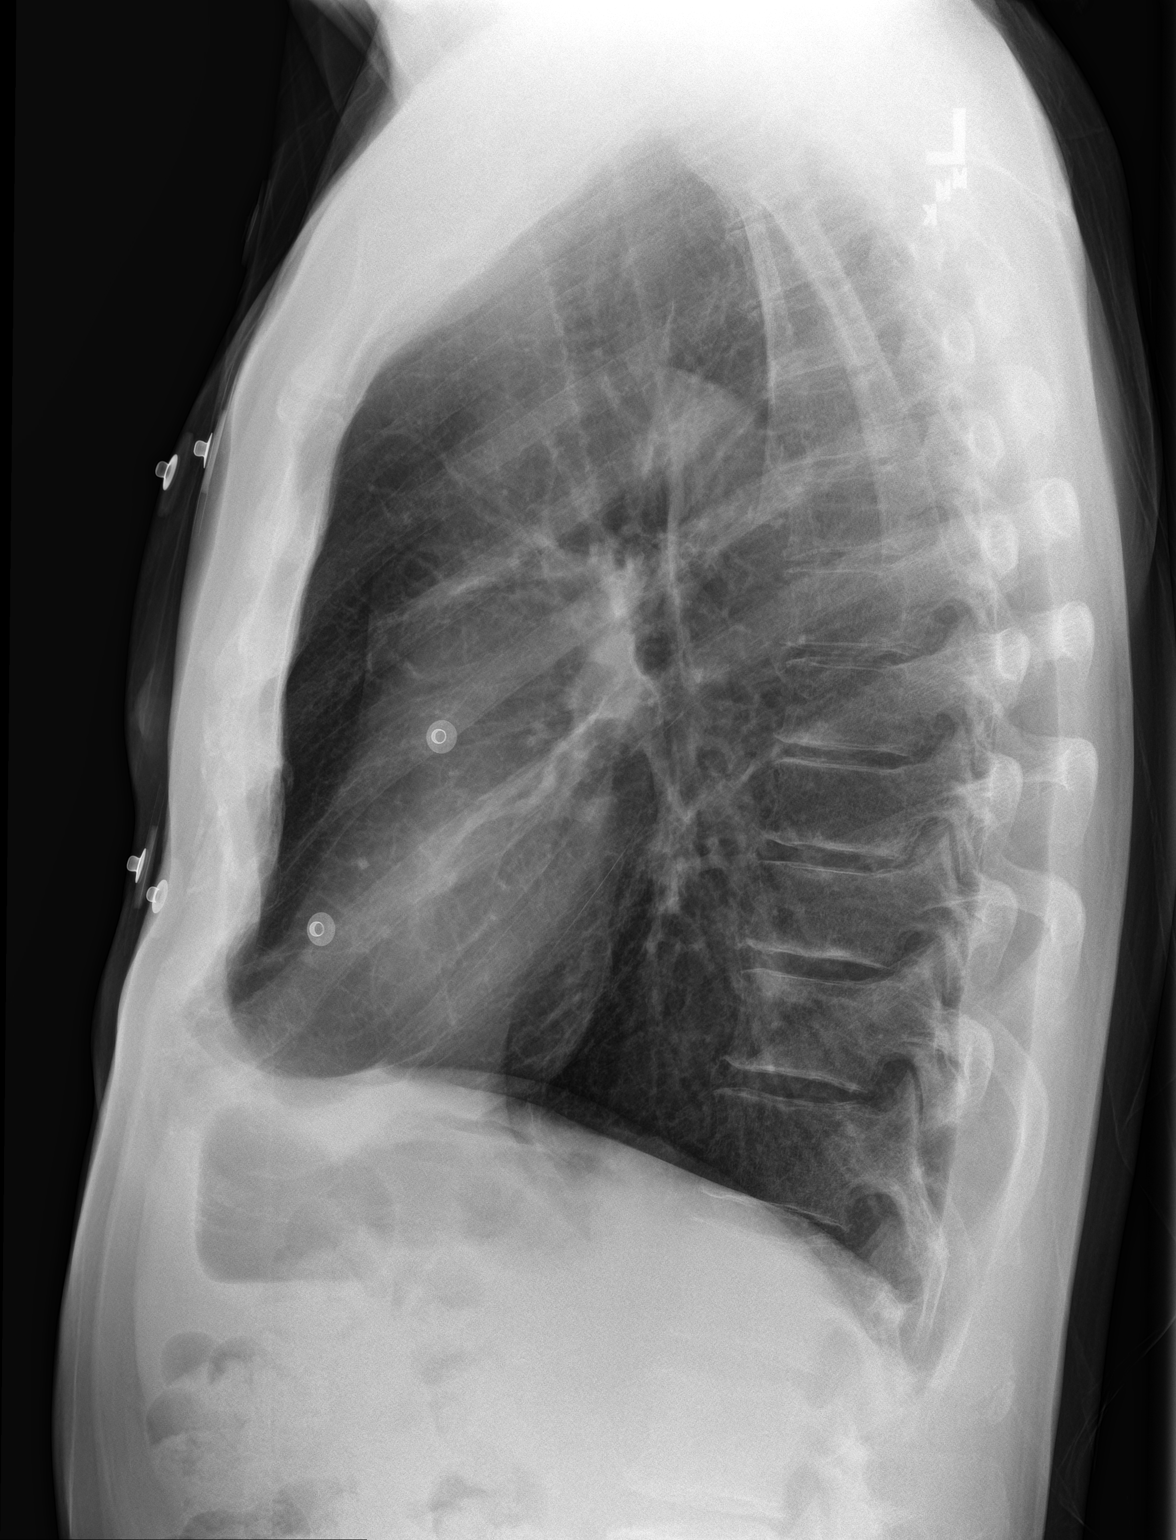

[2 of 2 positions shown; findings below may reference images not displayed]

FINDINGS: The lungs are hyperinflated with hemidiaphragm flattening. There is
no focal infiltrate. The heart and pulmonary vascularity are normal.
The mediastinum is normal in width. There is calcification in the
wall of the aortic arch. The trachea is midline. The bony thorax
exhibits no acute abnormality.
IMPRESSION: COPD.  No pneumonia, CHF, nor other acute cardiopulmonary disease.

Thoracic aortic atherosclerosis.
